# Patient Record
Sex: Female | Born: 1985 | Smoking: Never smoker
Health system: Southern US, Community
[De-identification: ages and names within clinical notes are randomized; demographics above are authoritative.]

## PROBLEM LIST (undated history)

## (undated) DIAGNOSIS — Z9889 Other specified postprocedural states: Secondary | ICD-10-CM

## (undated) DIAGNOSIS — J45909 Unspecified asthma, uncomplicated: Secondary | ICD-10-CM

## (undated) HISTORY — PX: CARPAL TUNNEL RELEASE: SHX101

---

## 2013-08-28 HISTORY — PX: OTHER SURGICAL HISTORY: SHX169

## 2014-08-17 ENCOUNTER — Ambulatory Visit: Payer: No Typology Code available for payment source | Attending: Chiropractic Medicine | Admitting: Physical Therapy

## 2014-11-09 LAB — OB RESULTS CONSOLE HIV ANTIBODY (ROUTINE TESTING): HIV: NONREACTIVE

## 2014-11-09 LAB — OB RESULTS CONSOLE RPR: RPR: NONREACTIVE

## 2014-11-09 LAB — OB RESULTS CONSOLE RUBELLA ANTIBODY, IGM: RUBELLA: IMMUNE

## 2014-11-09 LAB — OB RESULTS CONSOLE ANTIBODY SCREEN: ANTIBODY SCREEN: NEGATIVE

## 2014-11-09 LAB — OB RESULTS CONSOLE GC/CHLAMYDIA
CHLAMYDIA, DNA PROBE: NEGATIVE
GC PROBE AMP, GENITAL: NEGATIVE

## 2014-11-09 LAB — OB RESULTS CONSOLE ABO/RH: RH Type: NEGATIVE

## 2014-11-09 LAB — OB RESULTS CONSOLE HEPATITIS B SURFACE ANTIGEN: Hepatitis B Surface Ag: NEGATIVE

## 2014-12-15 ENCOUNTER — Other Ambulatory Visit (HOSPITAL_COMMUNITY): Payer: Self-pay | Admitting: Obstetrics and Gynecology

## 2014-12-15 DIAGNOSIS — O280 Abnormal hematological finding on antenatal screening of mother: Secondary | ICD-10-CM

## 2015-01-12 ENCOUNTER — Encounter (HOSPITAL_COMMUNITY): Payer: Self-pay

## 2015-01-12 ENCOUNTER — Ambulatory Visit (HOSPITAL_COMMUNITY)
Admission: RE | Admit: 2015-01-12 | Discharge: 2015-01-12 | Disposition: A | Discharge status: Home / Self Care | Payer: Medicaid Other | Source: Ambulatory Visit | Attending: Obstetrics and Gynecology | Admitting: Obstetrics and Gynecology

## 2015-01-12 DIAGNOSIS — Z315 Encounter for genetic counseling: Secondary | ICD-10-CM | POA: Diagnosis present

## 2015-01-12 DIAGNOSIS — Z3689 Encounter for other specified antenatal screening: Secondary | ICD-10-CM | POA: Insufficient documentation

## 2015-01-12 DIAGNOSIS — Z3A20 20 weeks gestation of pregnancy: Secondary | ICD-10-CM | POA: Diagnosis not present

## 2015-01-12 DIAGNOSIS — O09899 Supervision of other high risk pregnancies, unspecified trimester: Secondary | ICD-10-CM | POA: Insufficient documentation

## 2015-01-12 DIAGNOSIS — O288 Other abnormal findings on antenatal screening of mother: Secondary | ICD-10-CM

## 2015-01-12 DIAGNOSIS — O280 Abnormal hematological finding on antenatal screening of mother: Secondary | ICD-10-CM | POA: Insufficient documentation

## 2015-01-12 DIAGNOSIS — O351XX0 Maternal care for (suspected) chromosomal abnormality in fetus, not applicable or unspecified: Secondary | ICD-10-CM | POA: Insufficient documentation

## 2015-01-12 HISTORY — DX: Unspecified asthma, uncomplicated: J45.909

## 2015-01-12 NOTE — Progress Notes (Signed)
Genetic Counseling  High-Risk Gestation Note  Appointment Date:  01/12/2015 Referred By: Ferman HammingMcNamara, Michael T, MD Date of Birth:  [birth year]   Pregnancy History: G2P1001 Estimated Date of Delivery: 06/01/15 Estimated Gestational Age: 583w0d Attending: Particia NearingMartha Decker, MD    Ms. Miranda Thomas was seen for genetic counseling because of an increased risk for fetal Down syndrome based on Quad screening through [facility].  She was counseled regarding the Quad screen result and the associated 1 in 130 risk for fetal Down syndrome.  We reviewed chromosomes, nondisjunction, and the common features and variable prognosis of Down syndrome.  In addition, we reviewed the screen adjusted reduction in risks for trisomy 18 and ONTDs.  We also discussed other explanations for a screen positive result including: a gestational dating error, differences in maternal metabolism, and normal variation. They understand that this screening is not diagnostic for Down syndrome but provides a risk assessment.   We reviewed available screening options including noninvasive prenatal screening (NIPS)/cell free DNA (cfDNA) testing and detailed ultrasound.  She was counseled that screening tests are used to modify a patient's a priori risk for aneuploidy, typically based on age. This estimate provides a pregnancy specific risk assessment. We reviewed the benefits and limitations of each option. Specifically, we discussed the conditions for which each test screens, the detection rates, and false positive rates of each. She was also counseled regarding diagnostic testing via amniocentesis. We reviewed the approximate 1 in 300-500 risk for complications for amniocentesis, including spontaneous pregnancy loss.   Detailed ultrasound was performed at the time of today's visit. Complete ultrasound results reported separately. We reviewed that visualized fetal anatomy appeared normal.   After consideration of  all the options, she declined NIPS and amniocentesis today. She understands that screening tests cannot rule out all birth defects or genetic syndromes. The patient was advised of this limitation and states she still does not want additional testing at this time. Follow-up ultrasound is recommended to be offered in the third trimester given the elevated DIA (2.1 MoM) on the Quad screen and the associated increased risk for adverse pregnancy outcomes. Please contact our office if you would like this ultrasound to be scheduled with us.   Ms. Miranda Thomas was provided with written information regarding sickle cell anemia (SCA) including the carrier frequency and incidence in the African-American population, the availability of carrier testing and prenatal diagnosis if indicated.  In addition, we discussed that hemoglobinopathies are routinely screened for as part of the [place] newborn screening panel.  Screening via hemoglobin electrophoresis is available if desired and if not previously performed.   Both family histories were reviewed and found to be contributory for the father of the pregnancy born with a hole in the heart. He reportedly did not require surgical correction and is currently healthy. Congenital heart defects occur in approximately 1% of pregnancies.  Congenital heart defects may occur due to multifactorial influences, chromosomal abnormalities, genetic syndromes or environmental exposures.  Isolated heart defects are generally multifactorial.  Given the reported family history and assuming multifactorial inheritance, the risk for a congenital heart defect in the current pregnancy would be approximately 2-3%.  Additionally, Ms. Miranda Thomas reported a female maternal first cousin once removed with intellectual disability. He was reported to possibly have eyes that are close together but no other dysmorphic features and has no physical health problems. No underlying etiology was known for his delays. Ms.  Miranda Thomas was counseled that there are many different causes  of intellectual disabilities including environmental, multifactorial, and genetic etiologies.  We discussed that a specific diagnosis for intellectual disability can be determined in approximately 50% of these individuals.  In the remaining 50% of individuals, a diagnosis may never be determined.  Regarding genetic causes, we discussed that chromosome aberrations (aneuploidy, deletions, duplications, insertions, and translocations) are responsible for a small percentage of individuals with intellectual disability.  Many individuals with chromosome aberrations have additional differences, including congenital anomalies or minor dysmorphisms.  Likewise, single gene conditions are the underlying cause of intellectual delay in some families.  We discussed that many gene conditions have intellectual disability as a feature, but also often include other physical or medical differences.  Carrier screening for Fragile X syndrome would be available to the patient, if desired.  We discussed that without more specific information, it is difficult to provide an accurate risk assessment.  Further genetic counseling is warranted if more information is obtained.  Ms. Miranda Thomas denied exposure to environmental toxins or chemical agents. She denied the use of alcohol, tobacco or street drugs. She denied significant viral illnesses during the course of her pregnancy. Her medical and surgical histories were noncontributory.   I counseled Ms. Miranda Thomas for approximately 40 minutes regarding the above risks and available options.   Quinn PlowmanKaren Khaliel Morey, MS,  Certified Genetic Counselor  01/12/2015

## 2015-01-13 ENCOUNTER — Other Ambulatory Visit (HOSPITAL_COMMUNITY): Payer: Self-pay | Admitting: Obstetrics and Gynecology

## 2015-05-05 LAB — OB RESULTS CONSOLE GBS: STREP GROUP B AG: NEGATIVE

## 2015-06-03 ENCOUNTER — Other Ambulatory Visit (HOSPITAL_COMMUNITY): Payer: Self-pay | Admitting: Nurse Practitioner

## 2015-06-03 DIAGNOSIS — Z369 Encounter for antenatal screening, unspecified: Secondary | ICD-10-CM

## 2015-06-03 DIAGNOSIS — O480 Post-term pregnancy: Secondary | ICD-10-CM

## 2015-06-04 ENCOUNTER — Encounter (HOSPITAL_COMMUNITY): Payer: Self-pay | Admitting: *Deleted

## 2015-06-04 ENCOUNTER — Ambulatory Visit (HOSPITAL_COMMUNITY): Admission: RE | Admit: 2015-06-04 | Payer: Medicaid Other | Source: Ambulatory Visit

## 2015-06-04 ENCOUNTER — Telehealth (HOSPITAL_COMMUNITY): Payer: Self-pay | Admitting: *Deleted

## 2015-06-04 NOTE — Telephone Encounter (Signed)
Preadmission screen  

## 2015-06-07 ENCOUNTER — Ambulatory Visit (HOSPITAL_COMMUNITY)
Admission: RE | Admit: 2015-06-07 | Discharge: 2015-06-07 | Disposition: A | Discharge status: Home / Self Care | Payer: Medicaid Other | Source: Ambulatory Visit | Attending: Nurse Practitioner | Admitting: Nurse Practitioner

## 2015-06-07 ENCOUNTER — Inpatient Hospital Stay (HOSPITAL_COMMUNITY): Admission: RE | Admit: 2015-06-07 | Payer: Medicaid Other | Source: Ambulatory Visit

## 2015-06-07 DIAGNOSIS — O480 Post-term pregnancy: Secondary | ICD-10-CM | POA: Insufficient documentation

## 2015-06-07 DIAGNOSIS — Z36 Encounter for antenatal screening of mother: Secondary | ICD-10-CM | POA: Insufficient documentation

## 2015-06-07 DIAGNOSIS — Z369 Encounter for antenatal screening, unspecified: Secondary | ICD-10-CM

## 2015-06-08 ENCOUNTER — Inpatient Hospital Stay (HOSPITAL_COMMUNITY)
Admission: RE | Admit: 2015-06-08 | Discharge: 2015-06-11 | DRG: 765 | Disposition: A | Discharge status: Home / Self Care | Payer: Medicaid Other | Source: Ambulatory Visit | Attending: Family Medicine | Admitting: Family Medicine

## 2015-06-08 ENCOUNTER — Inpatient Hospital Stay (HOSPITAL_COMMUNITY): Payer: Medicaid Other | Admitting: Anesthesiology

## 2015-06-08 ENCOUNTER — Encounter (HOSPITAL_COMMUNITY): Payer: Self-pay

## 2015-06-08 ENCOUNTER — Encounter (HOSPITAL_COMMUNITY)
Admission: RE | Disposition: A | Discharge status: Home / Self Care | Payer: Self-pay | Source: Ambulatory Visit | Attending: Family Medicine

## 2015-06-08 DIAGNOSIS — O99810 Abnormal glucose complicating pregnancy: Secondary | ICD-10-CM

## 2015-06-08 DIAGNOSIS — Z98891 History of uterine scar from previous surgery: Secondary | ICD-10-CM

## 2015-06-08 DIAGNOSIS — O34211 Maternal care for low transverse scar from previous cesarean delivery: Secondary | ICD-10-CM | POA: Diagnosis present

## 2015-06-08 DIAGNOSIS — O480 Post-term pregnancy: Secondary | ICD-10-CM | POA: Diagnosis present

## 2015-06-08 DIAGNOSIS — Z9889 Other specified postprocedural states: Secondary | ICD-10-CM | POA: Diagnosis present

## 2015-06-08 DIAGNOSIS — Z6841 Body Mass Index (BMI) 40.0 and over, adult: Secondary | ICD-10-CM

## 2015-06-08 DIAGNOSIS — O99214 Obesity complicating childbirth: Secondary | ICD-10-CM | POA: Diagnosis present

## 2015-06-08 DIAGNOSIS — Z3A41 41 weeks gestation of pregnancy: Secondary | ICD-10-CM

## 2015-06-08 HISTORY — DX: Other specified postprocedural states: Z98.890

## 2015-06-08 LAB — CBC
HEMATOCRIT: 36.3 % (ref 36.0–46.0)
HEMOGLOBIN: 12.1 g/dL (ref 12.0–15.0)
MCH: 30.7 pg (ref 26.0–34.0)
MCHC: 33.3 g/dL (ref 30.0–36.0)
MCV: 92.1 fL (ref 78.0–100.0)
Platelets: 205 10*3/uL (ref 150–400)
RBC: 3.94 MIL/uL (ref 3.87–5.11)
RDW: 14 % (ref 11.5–15.5)
WBC: 12.2 10*3/uL — AB (ref 4.0–10.5)

## 2015-06-08 LAB — TYPE AND SCREEN
ABO/RH(D): B NEG
ANTIBODY SCREEN: NEGATIVE

## 2015-06-08 LAB — RPR: RPR Ser Ql: NONREACTIVE

## 2015-06-08 LAB — ABO/RH: ABO/RH(D): B NEG

## 2015-06-08 SURGERY — Surgical Case
Anesthesia: Epidural

## 2015-06-08 MED ORDER — FENTANYL CITRATE (PF) 100 MCG/2ML IJ SOLN
25.0000 ug | INTRAMUSCULAR | Status: DC | PRN
  Administered 2015-06-08: 50 ug via INTRAVENOUS

## 2015-06-08 MED ORDER — BUPIVACAINE HCL (PF) 0.25 % IJ SOLN
INTRAMUSCULAR | Status: AC
  Filled 2015-06-08: qty 10

## 2015-06-08 MED ORDER — ONDANSETRON HCL 4 MG/2ML IJ SOLN: 4.0000 mg | Freq: Three times a day (TID) | INTRAMUSCULAR | Status: DC | PRN

## 2015-06-08 MED ORDER — MORPHINE SULFATE (PF) 0.5 MG/ML IJ SOLN
INTRAMUSCULAR | Status: AC
  Filled 2015-06-08: qty 100

## 2015-06-08 MED ORDER — ONDANSETRON HCL 4 MG/2ML IJ SOLN: 4.0000 mg | Freq: Once | INTRAMUSCULAR | Status: DC | PRN

## 2015-06-08 MED ORDER — MEPERIDINE HCL 25 MG/ML IJ SOLN
INTRAMUSCULAR | Status: DC | PRN
  Administered 2015-06-08 (×4): 12.5 mg via INTRAVENOUS

## 2015-06-08 MED ORDER — TERBUTALINE SULFATE 1 MG/ML IJ SOLN
0.2500 mg | Freq: Once | INTRAMUSCULAR | Status: AC | PRN
Start: 2015-06-08 — End: 2015-06-08
  Administered 2015-06-08: 0.25 mg via SUBCUTANEOUS

## 2015-06-08 MED ORDER — SIMETHICONE 80 MG PO CHEW
80.0000 mg | CHEWABLE_TABLET | ORAL | Status: DC
  Administered 2015-06-09 – 2015-06-11 (×3): 80 mg via ORAL
  Filled 2015-06-08 (×3): qty 1

## 2015-06-08 MED ORDER — EPHEDRINE 5 MG/ML INJ
10.0000 mg | INTRAVENOUS | Status: DC | PRN
Start: 2015-06-08 — End: 2015-06-08

## 2015-06-08 MED ORDER — CEFAZOLIN SODIUM-DEXTROSE 2-3 GM-% IV SOLR
2.0000 g | INTRAVENOUS | Status: AC
  Administered 2015-06-08: 2 g via INTRAVENOUS
  Filled 2015-06-08: qty 50

## 2015-06-08 MED ORDER — LACTATED RINGERS IV SOLN
500.0000 mL | INTRAVENOUS | Status: DC | PRN
  Administered 2015-06-08: 300 mL via INTRAVENOUS
  Administered 2015-06-08 (×3): 500 mL via INTRAVENOUS

## 2015-06-08 MED ORDER — OXYTOCIN 40 UNITS IN LACTATED RINGERS INFUSION - SIMPLE MED
62.5000 mL/h | INTRAVENOUS | Status: DC
Start: 2015-06-08 — End: 2015-06-08

## 2015-06-08 MED ORDER — KETOROLAC TROMETHAMINE 30 MG/ML IJ SOLN: 30.0000 mg | Freq: Four times a day (QID) | INTRAMUSCULAR | Status: AC | PRN

## 2015-06-08 MED ORDER — LIDOCAINE-EPINEPHRINE (PF) 2 %-1:200000 IJ SOLN
INTRAMUSCULAR | Status: AC
  Filled 2015-06-08: qty 20

## 2015-06-08 MED ORDER — FLEET ENEMA 7-19 GM/118ML RE ENEM: 1.0000 | ENEMA | RECTAL | Status: DC | PRN

## 2015-06-08 MED ORDER — NALOXONE HCL 0.4 MG/ML IJ SOLN: 0.4000 mg | INTRAMUSCULAR | Status: DC | PRN

## 2015-06-08 MED ORDER — FENTANYL CITRATE (PF) 100 MCG/2ML IJ SOLN: 50.0000 ug | INTRAMUSCULAR | Status: DC | PRN

## 2015-06-08 MED ORDER — KETOROLAC TROMETHAMINE 30 MG/ML IJ SOLN
30.0000 mg | Freq: Four times a day (QID) | INTRAMUSCULAR | Status: AC | PRN
  Administered 2015-06-08: 30 mg via INTRAMUSCULAR

## 2015-06-08 MED ORDER — FENTANYL 2.5 MCG/ML BUPIVACAINE 1/10 % EPIDURAL INFUSION (WH - ANES)
14.0000 mL/h | INTRAMUSCULAR | Status: DC | PRN
  Administered 2015-06-08 (×2): 14 mL/h via EPIDURAL
  Filled 2015-06-08: qty 125

## 2015-06-08 MED ORDER — PRENATAL MULTIVITAMIN CH
1.0000 | ORAL_TABLET | Freq: Every day | ORAL | Status: DC
  Administered 2015-06-10: 1 via ORAL
  Filled 2015-06-08 (×2): qty 1

## 2015-06-08 MED ORDER — ACETAMINOPHEN 325 MG PO TABS: 650.0000 mg | ORAL_TABLET | ORAL | Status: DC | PRN

## 2015-06-08 MED ORDER — LACTATED RINGERS IV SOLN
INTRAVENOUS | Status: DC | PRN
  Administered 2015-06-08 (×2): via INTRAVENOUS

## 2015-06-08 MED ORDER — LACTATED RINGERS IV SOLN
INTRAVENOUS | Status: DC | PRN
  Administered 2015-06-08: 17:00:00 via INTRAVENOUS

## 2015-06-08 MED ORDER — MENTHOL 3 MG MT LOZG: 1.0000 | LOZENGE | OROMUCOSAL | Status: DC | PRN

## 2015-06-08 MED ORDER — ONDANSETRON HCL 4 MG/2ML IJ SOLN
INTRAMUSCULAR | Status: AC
  Filled 2015-06-08: qty 2

## 2015-06-08 MED ORDER — DIPHENHYDRAMINE HCL 50 MG/ML IJ SOLN: 12.5000 mg | INTRAMUSCULAR | Status: DC | PRN

## 2015-06-08 MED ORDER — CITRIC ACID-SODIUM CITRATE 334-500 MG/5ML PO SOLN
30.0000 mL | ORAL | Status: DC | PRN
  Administered 2015-06-08: 30 mL via ORAL
  Filled 2015-06-08: qty 15

## 2015-06-08 MED ORDER — ACETAMINOPHEN 325 MG PO TABS
650.0000 mg | ORAL_TABLET | ORAL | Status: DC | PRN
  Filled 2015-06-08: qty 2

## 2015-06-08 MED ORDER — ZOLPIDEM TARTRATE 5 MG PO TABS: 5.0000 mg | ORAL_TABLET | Freq: Every evening | ORAL | Status: DC | PRN

## 2015-06-08 MED ORDER — CEFAZOLIN SODIUM-DEXTROSE 2-3 GM-% IV SOLR
INTRAVENOUS | Status: AC
  Filled 2015-06-08: qty 50

## 2015-06-08 MED ORDER — MEPERIDINE HCL 25 MG/ML IJ SOLN
INTRAMUSCULAR | Status: AC
  Filled 2015-06-08: qty 1

## 2015-06-08 MED ORDER — BUPIVACAINE HCL (PF) 0.25 % IJ SOLN
INTRAMUSCULAR | Status: DC | PRN
  Administered 2015-06-08: 30 mL

## 2015-06-08 MED ORDER — LACTATED RINGERS IV SOLN
INTRAVENOUS | Status: DC
  Administered 2015-06-08: 14:00:00 via INTRAVENOUS
  Administered 2015-06-08 (×2): 125 mL/h via INTRAVENOUS

## 2015-06-08 MED ORDER — OXYTOCIN BOLUS FROM INFUSION: 500.0000 mL | INTRAVENOUS | Status: DC

## 2015-06-08 MED ORDER — BUPIVACAINE HCL (PF) 0.25 % IJ SOLN
INTRAMUSCULAR | Status: AC
  Filled 2015-06-08: qty 20

## 2015-06-08 MED ORDER — SIMETHICONE 80 MG PO CHEW: 80.0000 mg | CHEWABLE_TABLET | ORAL | Status: DC | PRN

## 2015-06-08 MED ORDER — LANOLIN HYDROUS EX OINT: 1.0000 "application " | TOPICAL_OINTMENT | CUTANEOUS | Status: DC | PRN

## 2015-06-08 MED ORDER — OXYCODONE-ACETAMINOPHEN 5-325 MG PO TABS
1.0000 | ORAL_TABLET | ORAL | Status: DC | PRN
  Administered 2015-06-09 – 2015-06-11 (×8): 1 via ORAL
  Filled 2015-06-08 (×10): qty 1

## 2015-06-08 MED ORDER — KETOROLAC TROMETHAMINE 30 MG/ML IJ SOLN
INTRAMUSCULAR | Status: AC
  Filled 2015-06-08: qty 1

## 2015-06-08 MED ORDER — SCOPOLAMINE 1 MG/3DAYS TD PT72
1.0000 | MEDICATED_PATCH | Freq: Once | TRANSDERMAL | Status: DC
  Filled 2015-06-08: qty 1

## 2015-06-08 MED ORDER — OXYTOCIN 10 UNIT/ML IJ SOLN
INTRAMUSCULAR | Status: AC
  Filled 2015-06-08: qty 4

## 2015-06-08 MED ORDER — ONDANSETRON HCL 4 MG/2ML IJ SOLN
INTRAMUSCULAR | Status: DC | PRN
  Administered 2015-06-08: 4 mg via INTRAVENOUS

## 2015-06-08 MED ORDER — OXYTOCIN 10 UNIT/ML IJ SOLN
40.0000 [IU] | INTRAVENOUS | Status: DC | PRN
  Administered 2015-06-08: 40 [IU] via INTRAVENOUS

## 2015-06-08 MED ORDER — LACTATED RINGERS IV SOLN
INTRAVENOUS | Status: DC
  Administered 2015-06-08: 300 mL via INTRAUTERINE
  Administered 2015-06-08: 13:00:00 via INTRAUTERINE

## 2015-06-08 MED ORDER — SENNOSIDES-DOCUSATE SODIUM 8.6-50 MG PO TABS
2.0000 | ORAL_TABLET | ORAL | Status: DC
  Administered 2015-06-09 – 2015-06-11 (×3): 2 via ORAL
  Filled 2015-06-08 (×3): qty 2

## 2015-06-08 MED ORDER — LIDOCAINE HCL (PF) 1 % IJ SOLN
INTRAMUSCULAR | Status: DC | PRN
  Administered 2015-06-08 (×2): 4 mL

## 2015-06-08 MED ORDER — DIPHENHYDRAMINE HCL 25 MG PO CAPS
25.0000 mg | ORAL_CAPSULE | ORAL | Status: DC | PRN
  Administered 2015-06-08 – 2015-06-09 (×3): 25 mg via ORAL
  Filled 2015-06-08 (×3): qty 1

## 2015-06-08 MED ORDER — MORPHINE SULFATE (PF) 0.5 MG/ML IJ SOLN
INTRAMUSCULAR | Status: DC | PRN
  Administered 2015-06-08: 4 mg via EPIDURAL

## 2015-06-08 MED ORDER — OXYCODONE-ACETAMINOPHEN 5-325 MG PO TABS
2.0000 | ORAL_TABLET | ORAL | Status: DC | PRN
  Administered 2015-06-11: 2 via ORAL

## 2015-06-08 MED ORDER — LACTATED RINGERS IV SOLN
INTRAVENOUS | Status: DC
  Administered 2015-06-08: via INTRAVENOUS

## 2015-06-08 MED ORDER — LIDOCAINE HCL (PF) 1 % IJ SOLN: 30.0000 mL | INTRAMUSCULAR | Status: DC | PRN

## 2015-06-08 MED ORDER — MEPERIDINE HCL 25 MG/ML IJ SOLN: 6.2500 mg | INTRAMUSCULAR | Status: DC | PRN

## 2015-06-08 MED ORDER — IBUPROFEN 600 MG PO TABS: 600.0000 mg | ORAL_TABLET | Freq: Four times a day (QID) | ORAL | Status: DC | PRN

## 2015-06-08 MED ORDER — PHENYLEPHRINE 40 MCG/ML (10ML) SYRINGE FOR IV PUSH (FOR BLOOD PRESSURE SUPPORT)
80.0000 ug | PREFILLED_SYRINGE | INTRAVENOUS | Status: DC | PRN
Start: 2015-06-08 — End: 2015-06-08
  Filled 2015-06-08: qty 20

## 2015-06-08 MED ORDER — NALOXONE HCL 1 MG/ML IJ SOLN
1.0000 ug/kg/h | INTRAVENOUS | Status: DC | PRN
  Filled 2015-06-08: qty 2

## 2015-06-08 MED ORDER — FENTANYL CITRATE (PF) 100 MCG/2ML IJ SOLN
INTRAMUSCULAR | Status: AC
  Filled 2015-06-08: qty 2

## 2015-06-08 MED ORDER — SODIUM CHLORIDE 0.9 % IJ SOLN: 3.0000 mL | INTRAMUSCULAR | Status: DC | PRN

## 2015-06-08 MED ORDER — DIBUCAINE 1 % RE OINT: 1.0000 "application " | TOPICAL_OINTMENT | RECTAL | Status: DC | PRN

## 2015-06-08 MED ORDER — OXYTOCIN 40 UNITS IN LACTATED RINGERS INFUSION - SIMPLE MED
1.0000 m[IU]/min | INTRAVENOUS | Status: DC
  Filled 2015-06-08: qty 1000

## 2015-06-08 MED ORDER — SODIUM BICARBONATE 8.4 % IV SOLN
INTRAVENOUS | Status: AC
  Filled 2015-06-08: qty 50

## 2015-06-08 MED ORDER — DIPHENHYDRAMINE HCL 25 MG PO CAPS: 25.0000 mg | ORAL_CAPSULE | Freq: Four times a day (QID) | ORAL | Status: DC | PRN

## 2015-06-08 MED ORDER — IBUPROFEN 600 MG PO TABS
600.0000 mg | ORAL_TABLET | Freq: Four times a day (QID) | ORAL | Status: DC
  Administered 2015-06-09 – 2015-06-11 (×8): 600 mg via ORAL
  Filled 2015-06-08 (×9): qty 1

## 2015-06-08 MED ORDER — TETANUS-DIPHTH-ACELL PERTUSSIS 5-2.5-18.5 LF-MCG/0.5 IM SUSP: 0.5000 mL | Freq: Once | INTRAMUSCULAR | Status: DC

## 2015-06-08 MED ORDER — OXYTOCIN 40 UNITS IN LACTATED RINGERS INFUSION - SIMPLE MED: 62.5000 mL/h | INTRAVENOUS | Status: AC

## 2015-06-08 MED ORDER — SODIUM BICARBONATE 8.4 % IV SOLN
INTRAVENOUS | Status: DC | PRN
  Administered 2015-06-08 (×3): 5 mL via EPIDURAL

## 2015-06-08 MED ORDER — TERBUTALINE SULFATE 1 MG/ML IJ SOLN
0.2500 mg | Freq: Once | INTRAMUSCULAR | Status: AC | PRN
  Administered 2015-06-08: 0.25 mg via SUBCUTANEOUS
  Filled 2015-06-08 (×2): qty 1

## 2015-06-08 MED ORDER — SCOPOLAMINE 1 MG/3DAYS TD PT72
MEDICATED_PATCH | TRANSDERMAL | Status: DC | PRN
  Administered 2015-06-08: 1 via TRANSDERMAL

## 2015-06-08 MED ORDER — SIMETHICONE 80 MG PO CHEW
80.0000 mg | CHEWABLE_TABLET | Freq: Three times a day (TID) | ORAL | Status: DC
  Administered 2015-06-09 – 2015-06-10 (×4): 80 mg via ORAL
  Filled 2015-06-08 (×5): qty 1

## 2015-06-08 MED ORDER — ONDANSETRON HCL 4 MG/2ML IJ SOLN
4.0000 mg | Freq: Four times a day (QID) | INTRAMUSCULAR | Status: DC | PRN
  Administered 2015-06-08: 4 mg via INTRAVENOUS
  Filled 2015-06-08: qty 2

## 2015-06-08 MED ORDER — WITCH HAZEL-GLYCERIN EX PADS: 1.0000 "application " | MEDICATED_PAD | CUTANEOUS | Status: DC | PRN

## 2015-06-08 MED ORDER — SCOPOLAMINE 1 MG/3DAYS TD PT72
MEDICATED_PATCH | TRANSDERMAL | Status: AC
  Filled 2015-06-08: qty 1

## 2015-06-08 SURGICAL SUPPLY — 31 items
CLAMP CORD UMBIL (MISCELLANEOUS) IMPLANT
CLOSURE WOUND 1/2 X4 (GAUZE/BANDAGES/DRESSINGS) ×2
CLOTH BEACON ORANGE TIMEOUT ST (SAFETY) ×3 IMPLANT
DECANTER SPIKE VIAL GLASS SM (MISCELLANEOUS) ×3 IMPLANT
DRAPE SHEET LG 3/4 BI-LAMINATE (DRAPES) IMPLANT
DRSG OPSITE 11X17.75 LRG (GAUZE/BANDAGES/DRESSINGS) ×3 IMPLANT
DRSG OPSITE POSTOP 4X10 (GAUZE/BANDAGES/DRESSINGS) ×3 IMPLANT
DURAPREP 26ML APPLICATOR (WOUND CARE) ×3 IMPLANT
ELECT REM PT RETURN 9FT ADLT (ELECTROSURGICAL) ×3
ELECTRODE REM PT RTRN 9FT ADLT (ELECTROSURGICAL) ×1 IMPLANT
EXTRACTOR VACUUM M CUP 4 TUBE (SUCTIONS) IMPLANT
EXTRACTOR VACUUM M CUP 4' TUBE (SUCTIONS)
GLOVE BIOGEL PI IND STRL 7.0 (GLOVE) ×1 IMPLANT
GLOVE BIOGEL PI INDICATOR 7.0 (GLOVE) ×2
GLOVE ECLIPSE 7.0 STRL STRAW (GLOVE) ×6 IMPLANT
GOWN STRL REUS W/TWL LRG LVL3 (GOWN DISPOSABLE) ×6 IMPLANT
KIT ABG SYR 3ML LUER SLIP (SYRINGE) IMPLANT
NEEDLE HYPO 22GX1.5 SAFETY (NEEDLE) ×3 IMPLANT
NEEDLE HYPO 25X5/8 SAFETYGLIDE (NEEDLE) IMPLANT
NS IRRIG 1000ML POUR BTL (IV SOLUTION) ×3 IMPLANT
PACK C SECTION WH (CUSTOM PROCEDURE TRAY) ×3 IMPLANT
PAD OB MATERNITY 4.3X12.25 (PERSONAL CARE ITEMS) ×3 IMPLANT
PENCIL SMOKE EVAC W/HOLSTER (ELECTROSURGICAL) ×3 IMPLANT
RTRCTR C-SECT PINK 25CM LRG (MISCELLANEOUS) ×3 IMPLANT
STRIP CLOSURE SKIN 1/2X4 (GAUZE/BANDAGES/DRESSINGS) ×4 IMPLANT
SUT VIC AB 0 CTX 36 (SUTURE) ×6
SUT VIC AB 0 CTX36XBRD ANBCTRL (SUTURE) ×3 IMPLANT
SUT VIC AB 4-0 KS 27 (SUTURE) IMPLANT
SYR 30ML LL (SYRINGE) ×3 IMPLANT
TOWEL OR 17X24 6PK STRL BLUE (TOWEL DISPOSABLE) ×3 IMPLANT
TRAY FOLEY CATH SILVER 14FR (SET/KITS/TRAYS/PACK) ×3 IMPLANT

## 2015-06-08 NOTE — Progress Notes (Signed)
Pt arrived to the unit. Pressure dressing saturated. RN removed and assessed, honeycomb saturated. New honeycomb placed using sterile technique and pressure dressing re-applied. Site re-assessed and honeycomb saturated again. Dr. Wende Mott notified and ordered to change the dressing again and recheck for bleeding and notify him if bleeding occurs. Will continue to monitor.

## 2015-06-08 NOTE — Anesthesia Procedure Notes (Signed)
Epidural Patient location during procedure: OB  Staffing Anesthesiologist: Fergie Sherbert Performed by: anesthesiologist   Preanesthetic Checklist Completed: patient identified, site marked, surgical consent, pre-op evaluation, timeout performed, IV checked, risks and benefits discussed and monitors and equipment checked  Epidural Patient position: sitting Prep: ChloraPrep and site prepped and draped Patient monitoring: continuous pulse ox and blood pressure Approach: midline Location: L3-L4 Injection technique: LOR saline  Needle:  Needle type: Tuohy  Needle gauge: 17 G Needle length: 9 cm and 9 Needle insertion depth: 9 cm Catheter type: closed end flexible Catheter size: 19 Gauge Catheter at skin depth: 15 cm Test dose: negative  Assessment Events: blood not aspirated, injection not painful, no injection resistance, negative IV test and no paresthesia  Additional Notes Patient identified. Risks/Benefits/Options discussed with patient including but not limited to bleeding, infection, nerve damage, paralysis, failed block, incomplete pain control, headache, blood pressure changes, nausea, vomiting, reactions to medication both or allergic, itching and postpartum back pain. Confirmed with bedside nurse the patient's most recent platelet count. Confirmed with patient that they are not currently taking any anticoagulation, have any bleeding history or any family history of bleeding disorders. Patient expressed understanding and wished to proceed. All questions were answered. Sterile technique was used throughout the entire procedure. Please see nursing notes for vital signs. Test dose was given through epidural catheter and negative prior to continuing to dose epidural or start infusion. Warning signs of high block given to the patient including shortness of breath, tingling/numbness in hands, complete motor block, or any concerning symptoms with instructions to call for help. Patient was  given instructions on fall risk and not to get out of bed. All questions and concerns addressed with instructions to call with any issues or inadequate analgesia.

## 2015-06-08 NOTE — Anesthesia Postprocedure Evaluation (Signed)
  Anesthesia Post-op Note  Patient: Nonah Mattes  Procedure(s) Performed: Procedure(s): CESAREAN SECTION (N/A)  Patient Location: PACU  Anesthesia Type:Epidural  Level of Consciousness: awake, alert  and oriented  Airway and Oxygen Therapy: Patient Spontanous Breathing  Post-op Pain: none  Post-op Assessment: Post-op Vital signs reviewed, Patient's Cardiovascular Status Stable, Respiratory Function Stable, Patent Airway, No signs of Nausea or vomiting, Pain level controlled, No headache, No backache, Spinal receding and Patient able to bend at knees LLE Motor Response: No movement due to regional block   RLE Motor Response: No movement due to regional block   L Sensory Level: L1-Inguinal (groin) region R Sensory Level: L1-Inguinal (groin) region  Post-op Vital Signs: Reviewed and stable  Last Vitals:  Filed Vitals:   06/08/15 1945  BP: 116/73  Pulse: 73  Temp: 36.9 C  Resp: 24    Complications: No apparent anesthesia complications

## 2015-06-08 NOTE — Progress Notes (Signed)
Patient ID: Miranda Thomas, female   DOB: 07-27-1986, 29 y.o.   MRN: [id number] Patient has recurrent variable, prolonged decels which cannot be resolved with position changes and is remote from delivery.  Will move to abdominal delivery.  Risks include but are not limited to bleeding, infection, injury to surrounding structures, including bowel, bladder and ureters, blood clots, and death.  Likelihood of success is high.

## 2015-06-08 NOTE — Op Note (Signed)
Miranda Thomas PROCEDURE DATE: 06/08/2015  6:17 PM  PATIENT:  Miranda Thomas  29 y.o. female  PRE-OPERATIVE DIAGNOSIS:  Repeat Cesarean Section for Non reassuring fetal heart rate  POST-OPERATIVE DIAGNOSIS:  Repeat Cesarean Section for Non reassuring fetal heart rate  PROCEDURE:  Procedure(s): CESAREAN SECTION (N/A)  SURGEON:  Surgeon(s) and Role:    * Reva Bores, MD - Primary    * Federico Flake, MD - Fellow  ANESTHESIA:   regional  LOCAL MEDICATIONS USED:  MARCAINE     INDICATIONS: Miranda Thomas is a 29 y.o. G2P2001 at [redacted]w[redacted]d here for cesarean section secondary to the indications listed under preoperative diagnoses; please see preoperative note for further details.  The risks of cesarean section were discussed with the patient including but were not limited to: bleeding which may require transfusion or reoperation; infection which may require antibiotics; injury to bowel, bladder, ureters or other surrounding organs; injury to the fetus; need for additional procedures including hysterectomy in the event of a life-threatening hemorrhage; placental abnormalities wth subsequent pregnancies, incisional problems, thromboembolic phenomenon and other postoperative/anesthesia complications.   The patient concurred with the proposed plan, giving informed written consent for the procedure.    Decision to move to urgent CS was made at 17:07, initial skin incision at 17:19 nd infant delivered at 1722.  FINDINGS:  Viable female infant in cephalic presentation with tight nuchal cord and body cord.  Apgars 8 and 9.  Clear amniotic fluid.  Intact placenta, three vessel cord.  Normal uterus, fallopian tubes and ovaries bilaterally. Tubal cyst present on right.  Cord pH: 7.165  ANESTHESIA: Epidural INTRAVENOUS FLUIDS: 2200 ml ESTIMATED BLOOD LOSS: 600 ml URINE OUTPUT:  675 ml SPECIMENS: Placenta sent to Pathology COMPLICATIONS: None immediate  PROCEDURE IN DETAIL:  The patient  preoperatively received intravenous antibiotics and had sequential compression devices applied to her lower extremities.  She was then taken to the operating room where the epidural anesthesia was dosed up to surgical level and was found to be adequate. She was then placed in a dorsal supine position with a leftward tilt, and prepped and draped in a sterile manner.  A foley catheter was already in place from receiving epidural and attached to constant gravity.  After an adequate timeout was performed, a Pfannenstiel skin incision was made with scalpel and carried through to the underlying layer of fascia. The fascia was incised in the midline, and this incision was extended bilaterally bluntly.  Kocher clamps were applied to the superior aspect of the fascial incision and the underlying rectus muscles were dissected off bluntly. The rectus muscles were separated in the midline bluntly and the peritoneum was entered bluntly. Attention was turned to the lower uterine segment where a low transverse hysterotomy was made with a scalpel and extended bilaterally bluntly.  The infant was successfully delivered, the cord was clamped and cut and the infant was handed over to awaiting neonatology team. Uterine massage was then administered, and the placenta delivered intact with a three-vessel cord. The uterus was then cleared of clot and debris.  The hysterotomy was closed with 0 Vicryl in a running locked fashion, several figure-of eight stitches were place to imbricate the hysterotomy and support hemostasis.  The pelvis was cleared of all clot and debris. Hemostasis was confirmed on all surfaces.  The peritoneum and the muscles were reapproximated using 0 Vicryl interrupted stitches. The fascia was then closed using 0 Vicryl in a running fashion.  The subcutaneous layer  was irrigated, then reapproximated with 2-0 plain gut interrupted stitches, and 30 ml of 0.25% Marcaine was injected subcutaneously around the incision.   The skin was closed with a 4-0 Vicryl subcuticular stitch. The patient tolerated the procedure well. Sponge, lap, instrument and needle counts were correct x 2.  She was taken to the recovery room in stable condition.   Federico Flake, MD Family Medicine, OB Fellow [facility]

## 2015-06-08 NOTE — H&P (Signed)
OBSTETRIC ADMISSION HISTORY AND PHYSICAL  Miranda Thomas is a 29 y.o. female G2P1001 with IUP at [redacted]w[redacted]d by LMP presenting for PDIOL and TOLAC. She reports +FMs, No LOF, no VB, no blurry vision, headaches or peripheral edema, and RUQ pain.  She plans on bottle feeding. She requests IUD for birth control.  Dating: By LMP --->  Estimated Date of Delivery: 06/01/15  Sono:    , CWD, normal anatomy, breech presentation, tranverse lie, 295g, 39% EFW   Prenatal History/Complications:  Past Medical History: Past Medical History  Diagnosis Date  . Asthma     Past Surgical History: Past Surgical History  Procedure Laterality Date  . Cesarean section    . Carpal tunnel release    . Carpal tunnel and tendonitis relief on both hands Bilateral 2015    Obstetrical History: OB History    Gravida Para Term Preterm AB TAB SAB Ectopic Multiple Living   Social History: Social History   Social History  . Marital Status: Unknown    Spouse Name: N/A  . Number of Children: N/A  . Years of Education: N/A   Social History Main Topics  . Smoking status: Never Smoker   . Smokeless tobacco: None  . Alcohol Use: No  . Drug Use: No  . Sexual Activity: Not Asked   Other Topics Concern  . None   Social History Narrative    Family History: Family History  Problem Relation Age of Onset  . COPD Mother   . Thyroid disease Mother   . Diabetes Father     Allergies: Allergies  Allergen Reactions  . Codeine   . Shellfish Allergy     Prescriptions prior to admission  Medication Sig Dispense Refill Last Dose  . Prenatal Multivit-Min-Fe-FA (PRENATAL VITAMINS PO) Take by mouth.   Taking     Review of Systems   All systems reviewed and negative except as stated in HPI  Height  (1.575 m), weight 254 lb (115.214 kg), last menstrual period 08/25/2014. General appearance: alert, cooperative and obese Lungs: clear to auscultation bilaterally Heart: regular  rate and rhythm Abdomen: soft, non-tender; bowel sounds normal Pelvic: Cervix dilated to 4, 70% effaced Extremities: Homans sign is negative, no sign of DVT; varicose vein of medial left leg Presentation: cephalic Fetal monitoringBaseline: 145 bpm, Variability: Good {> 6 bpm), Accelerations: Reactive and Decelerations: Early Uterine activityFrequency: Every 3-6 minutes, Duration: 60-80 seconds and Intensity: moderate    Prenatal labs: ABO, Rh: --/--/B NEG (10/11 0805) Antibody: NEG (10/11 0805) Rubella:   RPR: Nonreactive (03/14 0000)  HBsAg: Negative (03/14 0000)  HIV: Non-reactive (03/14 0000)  GBS: Negative (09/07 0000)  1 hr Glucola 156 Genetic screening  Abnormal  Anatomy US nml   Prenatal Transfer Tool  Maternal Diabetes: No- failed 1 hour (156) and passed 3hr Genetic Screening: Abnormal:  Results: Other: Elevated Inhibin, DSR 1:130 Maternal Ultrasounds/Referrals: Normal Fetal Ultrasounds or other Referrals:  None Maternal Substance Abuse:  No Significant Maternal Medications:  None Significant Maternal Lab Results: Lab values include: Group B Strep negative  Results for orders placed or performed during the hospital encounter of 06/08/15 (from the past 24 hour(s))  CBC   Collection Time: 06/08/15  8:05 AM  Result Value Ref Range   WBC 12.2 (H) 4.0 - 10.5 K/uL   RBC 3.94 3.87 - 5.11 MIL/uL   Hemoglobin 12.1 12.0 - 15.0 g/dL   HCT  36.3 36.0 - 46.0 %   MCV 92.1 78.0 - 100.0 fL   MCH 30.7 26.0 - 34.0 pg   MCHC 33.3 30.0 - 36.0 g/dL   RDW 16.1 09.6 - 04.5 %   Platelets 205 150 - 400 K/uL  Type and screen [facility]   Collection Time: 06/08/15  8:05 AM  Result Value Ref Range   ABO/RH(D) B NEG    Antibody Screen NEG    Sample Expiration 06/11/2015     Patient Active Problem List   Diagnosis Date Noted  . Status post induction of labor 06/08/2015  . Abnormal maternal serum screening test   . Encounter for fetal anatomic survey   . Abnormal  antenatal AFP screen   . High risk pregnancy with high inhibin   . Abnormal quad screen     Assessment: Miranda Thomas is a 30 y.o. G2P1001 at [redacted]w[redacted]d here for PDIOL and TOLAC.  #Labor: Induce and continue to augment with pitocin. No FB as dilated to 4 cm. No cytotec due to prior C-section.  #Pain: Plans for epidural #FWB: Cat I #ID:  GBS negative #MOF: Bottle #MOC: IUD (mirena) #Circ:  N/A (female)  Federico Flake 06/08/2015, 9:34 AM

## 2015-06-08 NOTE — Brief Op Note (Signed)
06/08/2015  6:17 PM  PATIENT:  Miranda Thomas  29 y.o. female  PRE-OPERATIVE DIAGNOSIS:  Repeat Cesarean Section for Non reassuring fetal heart rate  POST-OPERATIVE DIAGNOSIS:  Repeat Cesarean Section for Non reassuring fetal heart rate  PROCEDURE:  Procedure(s): CESAREAN SECTION (N/A)  SURGEON:  Surgeon(s) and Role:    * Reva Bores, MD - Primary    * Federico Flake, MD - Fellow  ANESTHESIA:   regional  EBL:  Total I/O In: 2200 [I.V.:2200] Out: 1275 [Urine:675; Blood:600]  BLOOD ADMINISTERED:none  DRAINS: none   LOCAL MEDICATIONS USED:  MARCAINE     SPECIMEN:  Source of Specimen:  Placenta  DISPOSITION OF SPECIMEN:  L&D  COUNTS:  YES  TOURNIQUET:  * No tourniquets in log *  DICTATION: .Note written in EPIC  PLAN OF CARE: Admit to inpatient   PATIENT DISPOSITION:  PACU - hemodynamically stable.   Delay start of Pharmacological VTE agent (>24hrs) due to surgical blood loss or risk of bleeding: yes

## 2015-06-08 NOTE — Progress Notes (Signed)
Labor Progress Note Miranda Thomas is a 29 y.o. G2P1001 at [redacted]w[redacted]d presented for IOL but found to be in latent labor  S: continues to have FHR decelerations.   O:  BP 107/36 mmHg  Pulse 74  Temp(Src) 98.5 F (36.9 C) (Oral)  Resp 20  Ht  (1.575 m)  Wt 254 lb (115.214 kg)  BMI 46.45 kg/m2  SpO2 100%  LMP 08/25/2014 EFM: 130/mod/+accels/ + late decelerations, recurrent.    CVE: Dilation: 6.5 Effacement (%): 80 Cervical Position: Anterior Station: -2 Presentation: Vertex Exam by:: Aubriauna Riner MD  Toco: q 1-105minutes  A&P: 29 y.o. G2P1001 [redacted]w[redacted]d here in spontaneous labor.  #Labor: Expectant management. Currently laboring without augmentation. Discussed with patient possible need for surgical intervention/C-section if infant continues to not tolerate labor. Currently there is not an emergent need for intervention which was again discussed.  #Pain: s/p epidural. Anesthesia made aware of possible need for C-section and came to assess epidural.  #FWB: Cat II. S/p terbutaline at 1306. Continue amnioinfusion and position changes.  Infant continues to have moderate variability and FHR has rebounded appropriately with exaggerated fowlers.  #GBS: Neg #TOLAC: monitor for s/sx of uterine rupture. Previous CS was for fetal intolerance and required general anesthesia at [facility].  Dr. Shawnie Pons aware of concerns with fetal tracing.  CS consent signed at previous check.   Federico Flake, MD 4:58 PM

## 2015-06-08 NOTE — Anesthesia Preprocedure Evaluation (Signed)
Anesthesia Evaluation  Patient identified by MRN, date of birth, ID band Patient awake    Reviewed: Allergy & Precautions, NPO status , Patient's Chart, lab work & pertinent test results  History of Anesthesia Complications Negative for: history of anesthetic complications  Airway Mallampati: III  TM Distance: >3 FB Neck ROM: Full    Dental no notable dental hx. (+) Dental Advisory Given   Pulmonary asthma ,    Pulmonary exam normal breath sounds clear to auscultation       Cardiovascular negative cardio ROS Normal cardiovascular exam Rhythm:Regular Rate:Normal     Neuro/Psych negative neurological ROS  negative psych ROS   GI/Hepatic negative GI ROS, Neg liver ROS,   Endo/Other  Morbid obesity  Renal/GU negative Renal ROS  negative genitourinary   Musculoskeletal negative musculoskeletal ROS (+)   Abdominal   Peds negative pediatric ROS (+)  Hematology negative hematology ROS (+)   Anesthesia Other Findings   Reproductive/Obstetrics negative OB ROS                             Anesthesia Physical Anesthesia Plan  ASA: III  Anesthesia Plan: Epidural   Post-op Pain Management:    Induction:   Airway Management Planned:   Additional Equipment:   Intra-op Plan:   Post-operative Plan:   Informed Consent: I have reviewed the patients History and Physical, chart, labs and discussed the procedure including the risks, benefits and alternatives for the proposed anesthesia with the patient or authorized representative who has indicated his/her understanding and acceptance.   Dental advisory given  Plan Discussed with: CRNA  Anesthesia Plan Comments:         Anesthesia Quick Evaluation

## 2015-06-08 NOTE — Transfer of Care (Signed)
Immediate Anesthesia Transfer of Care Note  Patient: Miranda Thomas  Procedure(s) Performed: Procedure(s): CESAREAN SECTION (N/A)  Patient Location: PACU  Anesthesia Type:Epidural  Level of Consciousness: awake, alert  and oriented  Airway & Oxygen Therapy: Patient Spontanous Breathing  Post-op Assessment: Report given to RN and Post -op Vital signs reviewed and stable  Post vital signs: Reviewed and stable  Last Vitals:  Filed Vitals:   06/08/15 1700  BP: 104/58  Pulse: 71  Temp:   Resp:     Complications: No apparent anesthesia complications

## 2015-06-08 NOTE — Progress Notes (Signed)
Labor Progress Note Miranda Thomas is a 29 y.o. G2P1001 at [redacted]w[redacted]d presented for IOL but found to be in latent labor  S: Arrived to room because of fetal heart rate deceleration at 1430.   O:  BP 83/60 mmHg  Pulse 78  Temp(Src) 98 F (36.7 C) (Oral)  Resp 16  Ht  (1.575 m)  Wt 254 lb (115.214 kg)  BMI 46.45 kg/m2  SpO2 100%  LMP 08/25/2014 EFM: 130/mod/+accels.    At 1430, HR nadir at 55 and was at this value for < 45 seconds with rebound to the 90s for 4.5 minutes with intermittent accelerations to >100. Placed in Knee to Chest position and HR returned to 120-125 with moderate variability.  Dr. Shawnie Pons was called to assess the patient's fetal monitoring strip and arrived in the room with patient in knee-chest position. She was moved to fowlers with peanut and infant tolerated this position. Review of strip showed Late Decelerations at 1429.   CVE: Dilation: 5.5 Effacement (%): 70, 80 Station: -2 Presentation: Vertex Exam by:: Cesia Orf MD  Toco: q 2-61minutes  A&P: 29 y.o. G2P1001 [redacted]w[redacted]d here in spontaneous labor.  #Labor: Expectant management. Currently laboring without augmentation. Discussed with patient possible need for surgical intervention/C-section if infant continues to not tolerate labor. Currently there is not an emergent need for intervention.  #Pain: s/p epidural. Anesthesia made aware of possible need for C-section and came to assess epidural.  #FWB: Cat II,  Continue amnioinfusion and position changes.  Infant continues to have moderate variability and HR has rebounded appropriately.  #GBS: Neg #TOLAC: monitor for s/sx of uterine rupture. Previous CS was for fetal intolerance and required general anesthesia at [facility].  Dr. Shawnie Pons aware of concerns with fetal tracing.   Federico Flake, MD 3:10 PM

## 2015-06-08 NOTE — Progress Notes (Signed)
Labor Progress Note Miranda Thomas is a 29 y.o. G2P1001 at [redacted]w[redacted]d presented for IOL but found to be in latent labor  S: Patient receiving amnioinfusion bolus. I went to room given HR deceleration.  O:  BP 112/60 mmHg  Pulse 108  Temp(Src) 98.4 F (36.9 C) (Oral)  Resp 16  Ht  (1.575 m)  Wt 254 lb (115.214 kg)  BMI 46.45 kg/m2  SpO2 100%  LMP 08/25/2014 EFM: 115/mod/no accels, prolonged deceleration with again nadir at 65. In the 60s for < 1 minute with moderate variability throughout and recovery to baseline with position change to lithotomy (attempted semi fowlers right and left).   CVE: Dilation: 5.5 Effacement (%): 70, 80 Station: -2 Presentation: Vertex Exam by:: [name]   A&P: 29 y.o. G2P1001 [redacted]w[redacted]d here in spontaneous labor.  #Labor: Expectant management. Currently laboring without augmentation #Pain: s/p epidural #FWB: Cat II,  Continue amnioinfusion and position changes. Continue to monitor closely.  #GBS: Neg #TOLAC: monitor for s/sx of uterine rupture. Previous CS was for fetal intolerance/arrest of dilation per op report.   Federico Flake, MD 2:01 PM

## 2015-06-08 NOTE — Progress Notes (Signed)
Labor Progress Note Miranda Thomas is a 29 y.o. G2P1001 at [redacted]w[redacted]d presented for IOL but found to be in latent labor  S: Patient was a scheduled induction but was found to be contracting and 4 cm dilated. She has continued to labor and contractions became every 3-4 minutes. She SROM at 1056.  At 1245 IUPC and FSE were placed. RN called team to the room for FHR deceleration at ~1300.  Patient was repositioned.   O:  BP 102/69 mmHg  Pulse 87  Temp(Src) 98.4 F (36.9 C) (Oral)  Resp 16  Ht  (1.575 m)  Wt 254 lb (115.214 kg)  BMI 46.45 kg/m2  LMP 08/25/2014 EFM: 125/mod/no accels, prolonged deceleration, nadir of 65 with moderate variability throughout.  HR was in the 70s for ~1 minute with interval increase to the mid 90s then return to previous baseline after 4 minutes.  Terbutaline administered.   CVE: Dilation: 5.5 Effacement (%): 80 Station: Ballotable Presentation: Vertex Exam by:: dr. Alvester Morin   A&P: 29 y.o. G2P1001 [redacted]w[redacted]d here in spontaneous labor.  #Labor: Expectant management. Currently laboring without augmentation #Pain: s/p epidural #FWB: Cat II,  Low risk of acidemia given moderate variability. Given recent SROM, will start amnioinfusion.  Attending physician Dr. Tinnie Gens aware of strip and was in the room due to prolonged deceleration.  #GBS: Neg #TOLAC: monitor for s/sx of uterine rupture. Previous CS was for fetal intolerance/arrest of dilation per op report.   Federico Flake, MD 1:16 PM

## 2015-06-09 ENCOUNTER — Encounter (HOSPITAL_COMMUNITY): Payer: Self-pay | Admitting: Family Medicine

## 2015-06-09 LAB — CBC
HEMATOCRIT: 30.6 % — AB (ref 36.0–46.0)
Hemoglobin: 10 g/dL — ABNORMAL LOW (ref 12.0–15.0)
MCH: 30.3 pg (ref 26.0–34.0)
MCHC: 32.7 g/dL (ref 30.0–36.0)
MCV: 92.7 fL (ref 78.0–100.0)
Platelets: 153 10*3/uL (ref 150–400)
RBC: 3.3 MIL/uL — ABNORMAL LOW (ref 3.87–5.11)
RDW: 14.3 % (ref 11.5–15.5)
WBC: 11.7 10*3/uL — AB (ref 4.0–10.5)

## 2015-06-09 MED ORDER — RHO D IMMUNE GLOBULIN 1500 UNIT/2ML IJ SOSY
300.0000 ug | PREFILLED_SYRINGE | Freq: Once | INTRAMUSCULAR | Status: AC
  Administered 2015-06-09: 300 ug via INTRAVENOUS
  Filled 2015-06-09: qty 2

## 2015-06-09 NOTE — Addendum Note (Signed)
Addendum  created 06/09/15 0854 by Elgie CongoNataliya H Mubashir Mallek, CRNA   Modules edited: Notes Section   Notes Section:  File: [id number]

## 2015-06-09 NOTE — Progress Notes (Signed)
Post Op Day 1  Subjective:  Miranda Thomas is a 29 y.o. G2P2001 2636w0d [facility].  No acute events overnight.  Pt denies problems with ambulating, voiding or po intake.  She denies nausea or vomiting.  Pain is well controlled.  She has had flatus. She has not had bowel movement.  Lochia Small.  Plan for birth control is IUD.  Method of Feeding: Bottle  Objective: BP 110/69 mmHg  Pulse 69  Temp(Src) 99.7 F (37.6 C) (Oral)  Resp 22  Ht 5\' 2"  (1.575 m)  Wt 115.214 kg (254 lb)  BMI 46.45 kg/m2  SpO2 99%  LMP 08/25/2014  Breastfeeding? Unknown  Physical Exam:  General: alert, cooperative and no distress Lochia:normal flow Chest: CTAB Heart: RRR no m/r/g Abdomen: +BS, soft, nontender, fundus firm below umbilicus DVT Evaluation: No evidence of DVT seen on physical exam. Extremities: no edema   Recent Labs  06/08/15 0805 06/09/15 0545  HGB 12.1 10.0*  HCT 36.3 30.6*    Assessment/Plan:  ASSESSMENT: Miranda Thomas is a 29 y.o. G2P2001 9136w0d pod #1 s/p  doing well.   Plan for discharge tomorrow and Contraception IUD (OP)   LOS: 1 day   Mickie Hillieran McKeag 06/09/2015, 6:27 AM   OB fellow attestation Post Partum Day 1 I have seen and examined this patient and agree with above documentation in the resident's note.   Miranda Thomas is a 29 y.o. G2P2001 s/p rLTCS for fetal intolerance of labor, failed TOLAC.  Pt denies problems with ambulating, voiding or po intake. Pain is well controlled.  Plan for birth control is IUD.  Method of Feeding: bottle  PE:  BP 110/69 mmHg  Pulse 69  Temp(Src) 99.7 F (37.6 C) (Oral)  Resp 22  Ht 5\' 2"  (1.575 m)  Wt 254 lb (115.214 kg)  BMI 46.45 kg/m2  SpO2 99%  LMP 08/25/2014  Breastfeeding? Unknown Gen: well appearing Heart: reg rate Lungs: normal WOB Fundus firm Ext: soft, no pain, no edema  Plan for discharge: continue postoperative care.  Plan on d/c POD#2 and POD#3   Federico FlakeKimberly Niles Newton, MD 7:33  AM

## 2015-06-09 NOTE — Anesthesia Postprocedure Evaluation (Signed)
  Anesthesia Post-op Note  Patient: Miranda MattesAngel J Thomas  Procedure(s) Performed: Procedure(s): CESAREAN SECTION (N/A)  Patient Location: Mother/Baby  Anesthesia Type:Epidural  Level of Consciousness: awake, alert  and oriented  Airway and Oxygen Therapy: Patient Spontanous Breathing  Post-op Pain: none  Post-op Assessment: Post-op Vital signs reviewed and Patient's Cardiovascular Status Stable LLE Motor Response: Purposeful movement   RLE Motor Response: Purposeful movement   L Sensory Level: L1-Inguinal (groin) region R Sensory Level: L1-Inguinal (groin) region  Post-op Vital Signs: Reviewed and stable  Last Vitals:  Filed Vitals:   06/09/15 0513  BP:   Pulse:   Temp: 37.6 C  Resp: 22    Complications: No apparent anesthesia complications

## 2015-06-09 NOTE — Progress Notes (Signed)
UR chart review completed.  

## 2015-06-10 ENCOUNTER — Encounter (HOSPITAL_COMMUNITY): Payer: Self-pay

## 2015-06-10 LAB — RH IG WORKUP (INCLUDES ABO/RH)
ABO/RH(D): B NEG
FETAL SCREEN: NEGATIVE
Gestational Age(Wks): 41
UNIT DIVISION: 0

## 2015-06-10 NOTE — Progress Notes (Signed)
Post Partum Day 2  Subjective:  Miranda Thomas is a 29 y.o. [id number] 3760w0d s/p [facility]CS [facility] PDIOL for TOLAC.  No acute events overnight.  Pt denies problems with ambulating, voiding or po intake.  She denies nausea or vomiting.  Pain is well controlled but worse at sides of C/S incision.  She has had flatus. She has not had bowel movement.  Lochia Small.  Plan for birth control is IUD.  Method of Feeding: bottle.  Objective: BP 111/47 mmHg  Pulse 66  Temp(Src) 98 F (36.7 C) (Oral)  Resp 20  Ht 5\' 2"  (1.575 m)  Wt 115.214 kg (254 lb)  BMI 46.45 kg/m2  SpO2 99%  LMP 08/25/2014  Breastfeeding? Unknown  Physical Exam:  General: alert, cooperative and no distress Lochia:normal flow Chest: CTAB Heart: RRR no m/r/g Abdomen: +BS, soft, nontender, fundus firm  Uterine Fundus: firm at/below umbilicus DVT Evaluation: No evidence of DVT seen on physical exam. Extremities: No LE edema   Recent Labs  06/08/15 0805 06/09/15 0545  HGB 12.1 10.0*  HCT 36.3 30.6*    Assessment/Plan:  ASSESSMENT: Miranda Thomas is a 29 y.o. G2P2002 6860w0d pod#2 s/p rLTCS doing well.   Plan for discharge tomorrow and Contraception IUD   LOS: 2 days   Jamelle HaringHillary M Fitzgerald 06/10/2015, 9:42 AM   I have seen and examined this patient and I agree with the above. Cam HaiSHAW, Romell Cavanah CNM 8:42 AM 06/12/2015

## 2015-06-11 DIAGNOSIS — Z98891 History of uterine scar from previous surgery: Secondary | ICD-10-CM

## 2015-06-11 MED ORDER — OXYCODONE-ACETAMINOPHEN 5-325 MG PO TABS: 1.0000 | ORAL_TABLET | ORAL | Status: AC | PRN

## 2015-06-11 MED ORDER — IBUPROFEN 600 MG PO TABS: 600.0000 mg | ORAL_TABLET | Freq: Four times a day (QID) | ORAL | Status: AC

## 2015-06-11 NOTE — Discharge Summary (Signed)
OB Discharge Summary  Patient Name: Miranda Thomas DOB: [birth year] MRN: [id number]  Date of admission: 06/08/2015 Delivering MD: Reva Bores   Date of discharge: 06/11/2015  Admitting diagnosis: INDUCTION Intrauterine pregnancy: [redacted]w[redacted]d     Secondary diagnosis: TOLAC     Discharge diagnosis: Term Pregnancy Delivered and rLTCS                                                                                                Post partum procedures:None  Augmentation: Pitocin  Complications: None  Principal Problem:   Status post induction of labor Active Problems:   Abnormal O'Sullivan glucose challenge test, antepartum   Status post repeat low transverse cesarean section   Failed trial of labor, delivered  Hospital course:  Induction of Labor With Cesarean Section  29 y.o. yo G2P2002 at [redacted]w[redacted]d was admitted to the hospital 06/08/2015 for induction of labor. Patient had a labor course significant for TOLAC. The patient went for cesarean section due to Non-Reassuring FHR, and delivered a Viable infant. Details of operation can be found in separate operative Note.  Patient had an uncomplicated postpartum course. She is ambulating, tolerating a regular diet, passing flatus, and urinating well.  Patient is discharged home in stable condition on 06/11/2015 10:50 AM.                                    Physical exam  Filed Vitals:   06/10/15 0650 06/10/15 1725 06/11/15 0030 06/11/15 0558  BP: 111/47 117/81 128/75 144/79  Pulse: 66 89 88 64  Temp: 98 F (36.7 C) 97.8 F (36.6 C) 97.9 F (36.6 C) 98.3 F (36.8 C)  TempSrc:  Oral Oral Oral  Resp: Height:      Weight:      SpO2:       General: alert, cooperative and no distress Lochia: appropriate Uterine Fundus: firm Incision: Healing well with no significant drainage DVT Evaluation: No evidence of DVT seen on physical exam. Labs: Lab Results  Component Value Date   WBC 11.7* 06/09/2015   HGB 10.0* 06/09/2015    HCT 30.6* 06/09/2015   MCV 92.7 06/09/2015   PLT 153 06/09/2015   No flowsheet data found.  Discharge instruction: per After Visit Summary and "Baby and Me Booklet".  Medications: No current facility-administered medications for this encounter.  Current outpatient prescriptions:  .  ibuprofen (ADVIL,MOTRIN) 600 MG tablet, Take 1 tablet (600 mg total) by mouth every 6 (six) hours., Disp: 30 tablet, Rfl: 0 .  oxyCODONE-acetaminophen (PERCOCET/ROXICET) 5-325 MG tablet, Take 1 tablet by mouth every 4 (four) hours as needed for moderate pain or severe pain., Disp: 30 tablet, Rfl: 0  Diet: routine diet  Activity: Advance as tolerated. Pelvic rest for 6 weeks.   Outpatient follow up:6 weeks No future appointments.  Postpartum contraception: IUD Mirena  Newborn Data: Live born female  Birth Weight: 6 lb 8.9 oz (2975 g) APGAR: 8, 9  Baby Feeding: Bottle Disposition:home with mother  06/11/2015 Hillary Vernie AmmonsM Fitzgerald, MD  [facility], PGY[facility]  OB fellow attestation I have seen and examined this patient and agree with above documentation in the resident's note.   Miranda Thomas is a 29 y.o. G2P2002 s/p rLTCS.   Pain is well controlled.  Plan for birth control is IUD.  Method of Feeding: bottle  PE:  BP 144/79 mmHg  Pulse 64  Temp(Src) 98.3 F (36.8 C) (Oral)  Resp 19  Ht 5\' 2"  (1.575 m)  Wt 254 lb (115.214 kg)  BMI 46.45 kg/m2  SpO2 99%  LMP 08/25/2014  Breastfeeding? Unknown Gen: well appearing Heart: reg rate Lungs: normal WOB Fundus firm Ext: soft, no pain, no edema   Recent Labs  06/09/15 0545  HGB 10.0*  HCT 30.6*   Plan: discharge today - postpartum care discussed - f/u clinic in 6 weeks for postpartum visit   Miranda FlakeKimberly Niles Eliya Bubar, MD 8:45 PM

## 2015-06-11 NOTE — Progress Notes (Signed)
I was walking in the hallway and there was a man standing outside of room #126. He was carrying a car seat and appearing to talk to a baby in the car seat. He then threw the car seat on the floor near my feet. The car seat was empty. He was laughing and I asked "What was the point of that?" and he stated "For the reaction and I enjoyed it". He was laughing and walked into room #126 and closed the door behind him. There were several staff members and visitors in the hallway that observed.

## 2015-06-11 NOTE — Discharge Instructions (Signed)
Miranda Thomas, congratulations on delivering your baby!  We recommend vaginal rest for 6 weeks, meaning do not put anything in your vagina (no sexual intercourse, do not use tampons, etc.), until you see your doctor for your follow-up visit in 5-6 weeks.  You have prescriptions for ibuprofen and percocet for pain.  Please seek medical attention if your wound becomes red or painful or with increased drainage. If you develop a fever (temperature of 100.4 F or higher), have severe abdominal pain or increased vaginal bleeding, please contact your doctor.  Postpartum Care After Cesarean Delivery After you deliver your newborn (postpartum period), the usual stay in the hospital is 24-72 hours. If there were problems with your labor or delivery, or if you have other medical problems, you might be in the hospital longer.  While you are in the hospital, you will receive help and instructions on how to care for yourself and your newborn during the postpartum period.  While you are in the hospital:  It is normal for you to have pain or discomfort from the incision in your abdomen. Be sure to tell your nurses when you are having pain, where the pain is located, and what makes the pain worse.  If you are breastfeeding, you may feel uncomfortable contractions of your uterus for a couple of weeks. This is normal. The contractions help your uterus get back to normal size.  It is normal to have some bleeding after delivery.  For the first 1-3 days after delivery, the flow is red and the amount may be similar to a period.  It is common for the flow to start and stop.  In the first few days, you may pass some small clots. Let your nurses know if you begin to pass large clots or your flow increases.  Do not  flush blood clots down the toilet before having the nurse look at them.  During the next 3-10 days after delivery, your flow should become more watery and pink or brown-tinged in color.  Ten to fourteen  days after delivery, your flow should be a small amount of yellowish-white discharge.  The amount of your flow will decrease over the first few weeks after delivery. Your flow may stop in 6-8 weeks. Most women have had their flow stop by 12 weeks after delivery.  You should change your sanitary pads frequently.  Wash your hands thoroughly with soap and water for at least 20 seconds after changing pads, using the toilet, or before holding or feeding your newborn.  Your intravenous (IV) tubing will be removed when you are drinking enough fluids.  The urine drainage tube (urinary catheter) that was inserted before delivery may be removed within 6-8 hours after delivery or when feeling returns to your legs. You should feel like you need to empty your bladder within the first 6-8 hours after the catheter has been removed.  In case you become weak, lightheaded, or faint, call your nurse before you get out of bed for the first time and before you take a shower for the first time.  Within the first few days after delivery, your breasts may begin to feel tender and full. This is called engorgement. Breast tenderness usually goes away within 48-72 hours after engorgement occurs. You may also notice milk leaking from your breasts. If you are not breastfeeding, do not stimulate your breasts. Breast stimulation can make your breasts produce more milk.  Spending as much time as possible with your newborn is very important.  During this time, you and your newborn can feel close and get to know each other. Having your newborn stay in your room (rooming in) will help to strengthen the bond with your newborn. It will give you time to get to know your newborn and become comfortable caring for your newborn.  Your hormones change after delivery. Sometimes the hormone changes can temporarily cause you to feel sad or tearful. These feelings should not last more than a few days. If these feelings last longer than that, you  should talk to your caregiver.  If desired, talk to your caregiver about methods of family planning or contraception.  Talk to your caregiver about immunizations. Your caregiver may want you to have the following immunizations before leaving the hospital:  Tetanus, diphtheria, and pertussis (Tdap) or tetanus and diphtheria (Td) immunization. It is very important that you and your family (including grandparents) or others caring for your newborn are up-to-date with the Tdap or Td immunizations. The Tdap or Td immunization can help protect your newborn from getting ill.  Rubella immunization.  Varicella (chickenpox) immunization.  Influenza immunization. You should receive this annual immunization if you did not receive the immunization during your pregnancy.   This information is not intended to replace advice given to you by your health care provider. Make sure you discuss any questions you have with your health care provider.   Document Released: 05/08/2012 Document Reviewed: 05/08/2012 Elsevier Interactive Patient Education [employer]2016 [employer] Inc.

## 2015-06-30 ENCOUNTER — Ambulatory Visit: Payer: Medicaid Other | Admitting: *Deleted

## 2015-06-30 ENCOUNTER — Encounter: Payer: Self-pay | Admitting: *Deleted

## 2015-06-30 VITALS — BP 116/55 | HR 98 | Temp 99.6°F | Ht 62.0 in | Wt 244.6 lb

## 2015-06-30 DIAGNOSIS — Z98891 History of uterine scar from previous surgery: Secondary | ICD-10-CM

## 2015-06-30 NOTE — Progress Notes (Signed)
Patient in for a wound check. C/S 3 weeks ago, low transverse incision. Patient denies pain or fever. States small amount of drainage and foul odor. Upon examination noted tiny open spot to right of midline, hard to touch underneath. Upon palpation small amount of clear bloody drainage noted. Cleaned area with sterile saline and 2x2 guaze, patted dry. Remainder of incision is well approximated. No redness or edema. Dr Marice Potterove came to examine wound. No orders given. Encouraged pt to keep wound clean and dry. Gave her 4X4 gauze and remainder of NS to clean the wound at home. Reviewed s/s of infection, call if drainage increases or if she starts to run fever. Patient voiced understanding.

## 2016-09-12 IMAGING — US US MFM FETAL BPP W/O NON-STRESS
1 series · 15 of 21 positions shown · non-contrast
Comparison: none

[Series 1: us mfm fetal bpp w/o non-stress · 21 acquisitions, 15 frames shown]
[im 1/21]
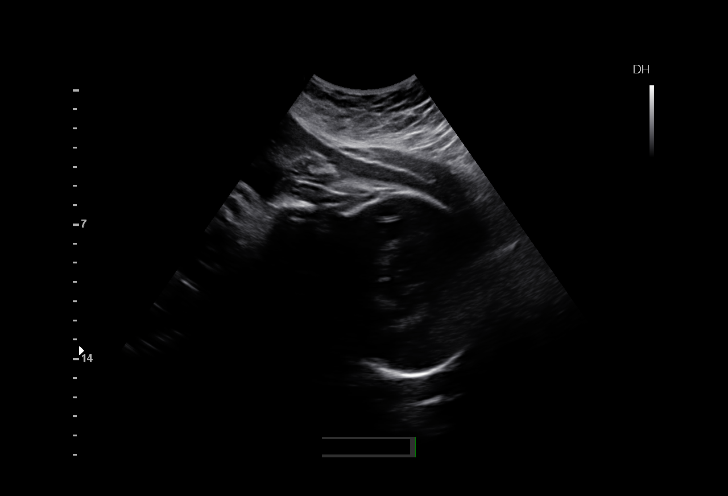
[im 3/21]
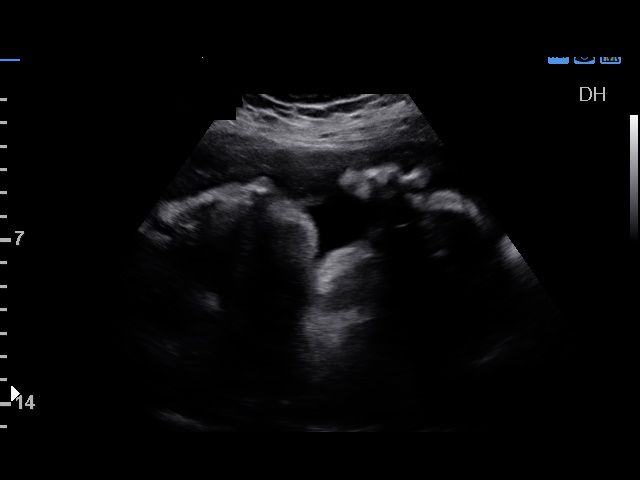
[im 4/21]
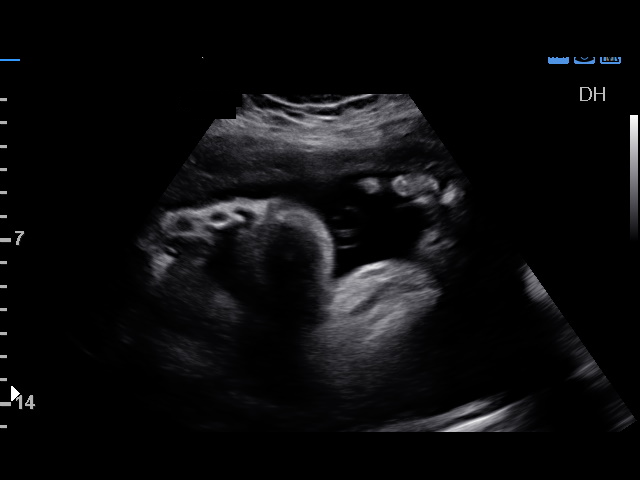
[im 5/21]
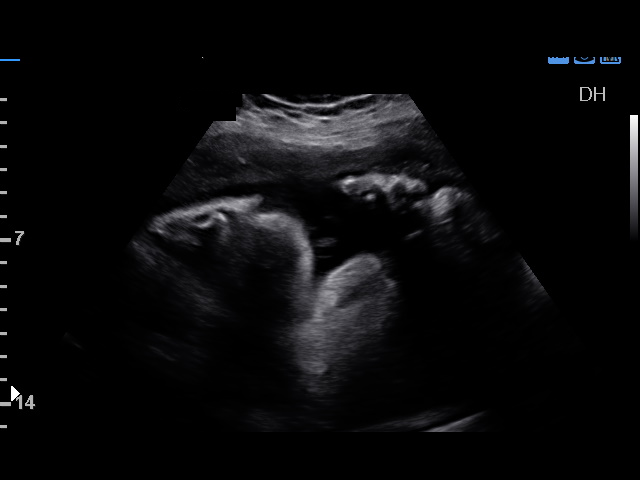
[im 7/21]
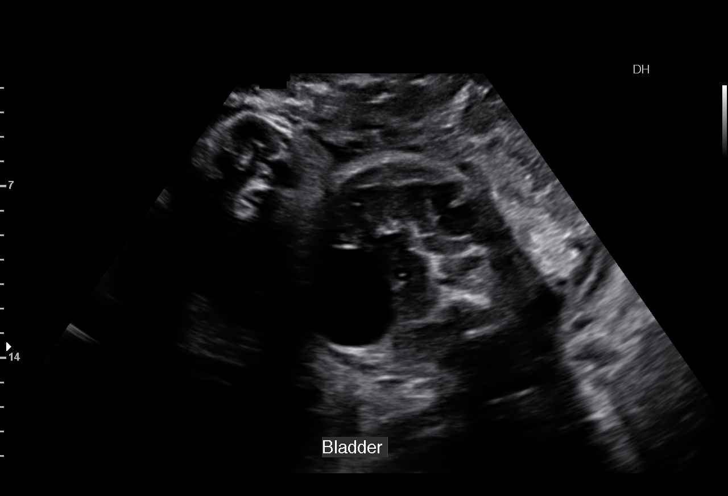
[im 8/21]
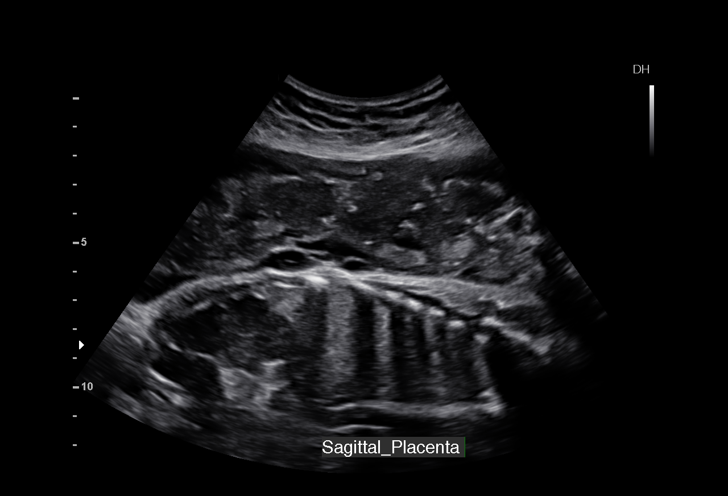
[im 10/21]
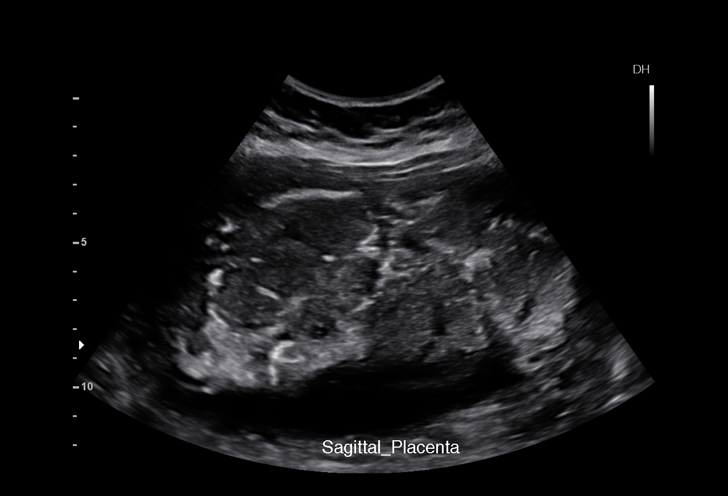
[im 11/21]
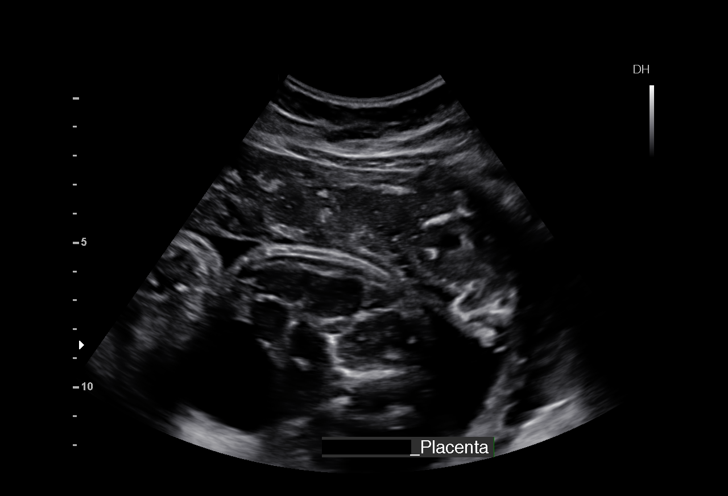
[im 12/21]
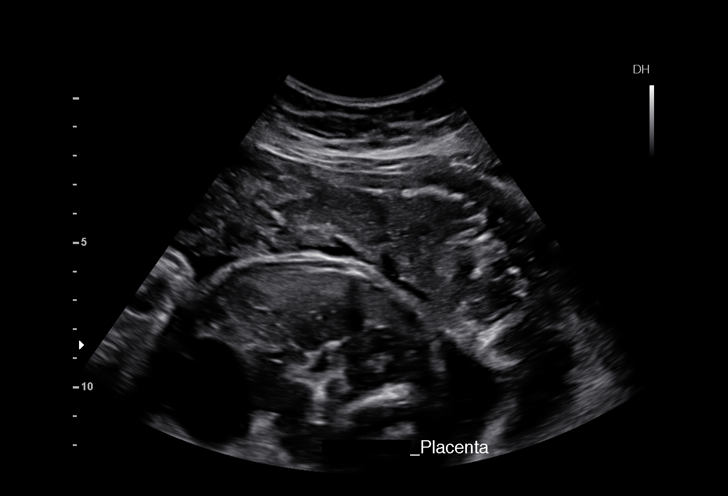
[im 14/21]
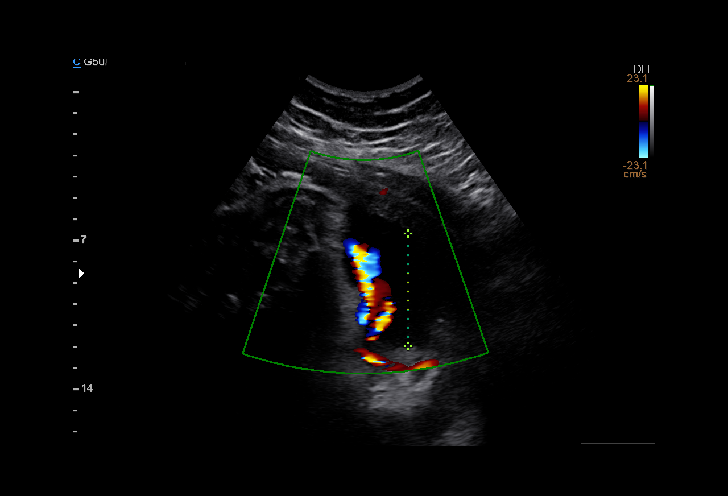
[im 15/21]
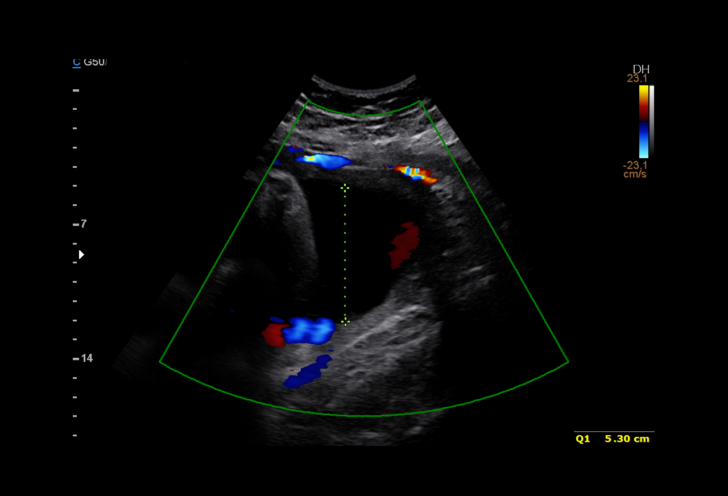
[im 17/21]
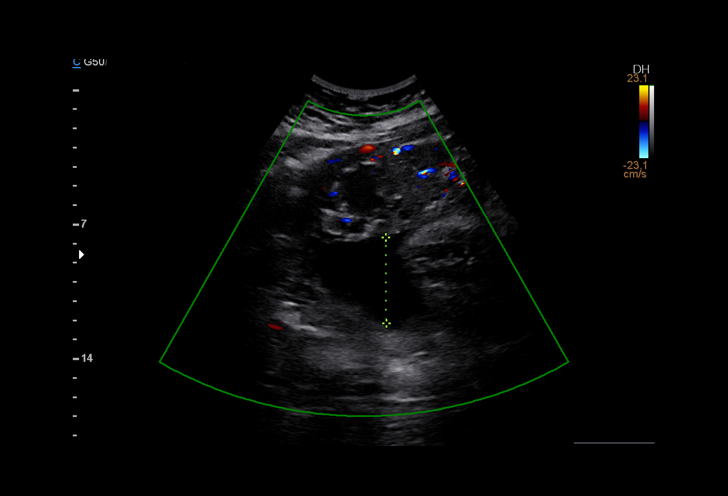
[im 18/21]
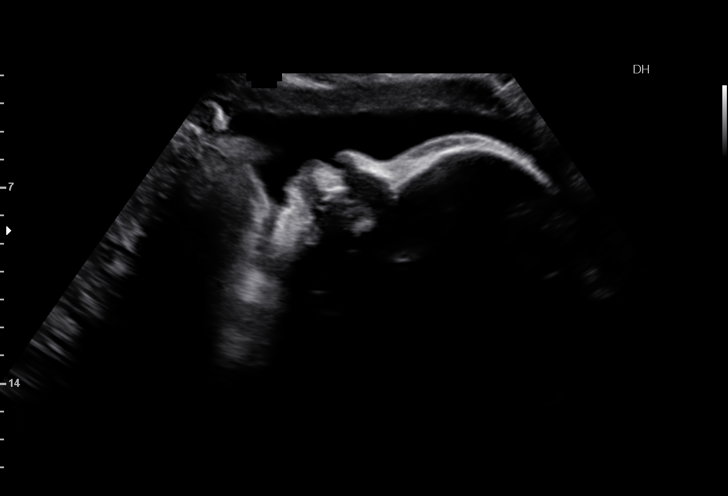
[im 19/21]
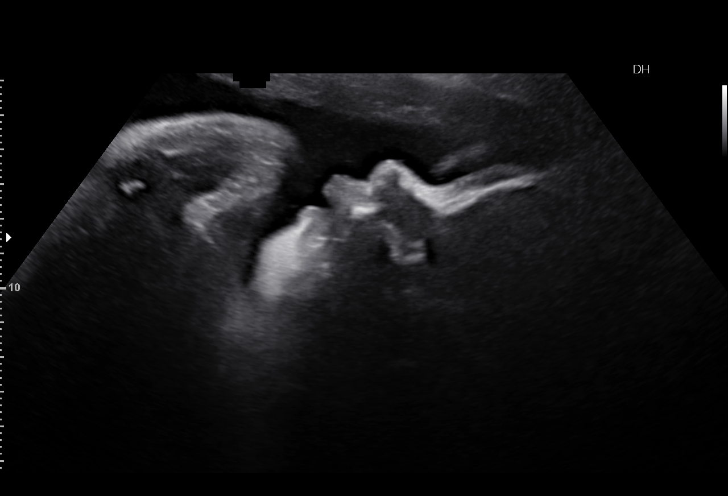
[im 21/21]
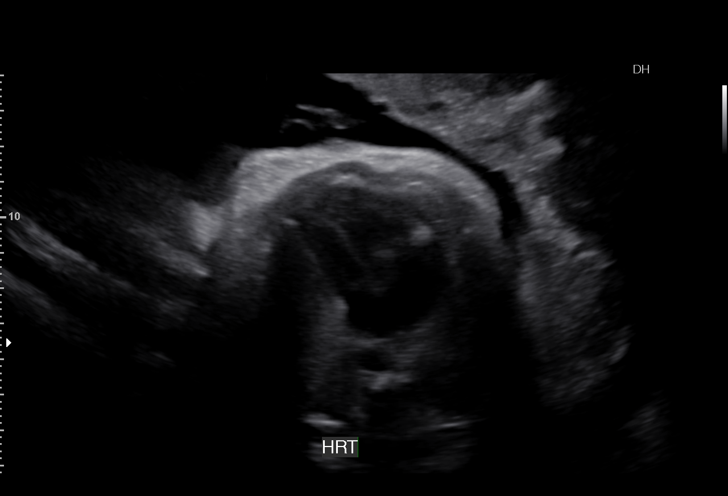

[15 of 21 positions shown; findings below may reference images not displayed]

OBSTETRICS REPORT
(Signed Final 06/08/2015 [DATE])

Date:

Service(s) Provided

Indications

40 weeks gestation of pregnancy
Postdate pregnancy (40-42 weeks)
Abnormal biochemical screen (quad) for Trisomy
21 ([DATE]) - declined further testing
Elevated Inhibin (2.18 MoM)
Asthma                                                 DMM.EM [id number]
History of cesarean delivery, currently pregnant
Obesity complicating pregnancy, second
trimester
Fetal Evaluation

Num Of             1
Fetuses:
Fetal Heart        138                          bpm
Rate:
Cardiac Activity:  Observed
Presentation:      Cephalic
Placenta:          Anterior, above cervical
os
P. Cord            Previously Visualized
Insertion:

Amniotic Fluid
AFI FV:      Subjectively within normal limits
AFI Sum:     22.46    cm    > 97  %Tile     Larg Pckt:    6.95   cm
RUQ:   5.3     cm    RLQ:   4.47    cm   LUQ:    6.95    cm   LLQ:    5.74   cm
Biophysical Evaluation

Amniotic F.V:   Pocket => 2 cm two          F. Tone:        Observed
planes
F. Movement:    Observed                    Score:          [DATE]
F. Breathing:   Not Observed
Gestational Age
LMP:           40w 6d        Date:  08/25/14                  EDD:   06/01/15
Best:          40w 6d    Det. By:   LMP  (08/25/14)           EDD:   06/01/15
Impression

SIUP at 40+6 weeks
Normal amniotic fluid volume
BPP [DATE] (-2 for no BM)

Per pt, NST reactive in office this AM (BPP [DATE]); will
attempt to obtain documentation of this NST.
Recommendations

For induction tomorrow

## 2022-08-21 ENCOUNTER — Other Ambulatory Visit: Payer: Self-pay

## 2022-08-21 ENCOUNTER — Encounter (HOSPITAL_BASED_OUTPATIENT_CLINIC_OR_DEPARTMENT_OTHER): Payer: Self-pay

## 2022-08-21 ENCOUNTER — Emergency Department (HOSPITAL_BASED_OUTPATIENT_CLINIC_OR_DEPARTMENT_OTHER)
Admission: EM | Admit: 2022-08-21 | Discharge: 2022-08-21 | Disposition: A | Discharge status: Home / Self Care | Payer: Medicaid Other | Attending: Emergency Medicine | Admitting: Emergency Medicine

## 2022-08-21 DIAGNOSIS — Z1152 Encounter for screening for COVID-19: Secondary | ICD-10-CM | POA: Insufficient documentation

## 2022-08-21 DIAGNOSIS — J101 Influenza due to other identified influenza virus with other respiratory manifestations: Secondary | ICD-10-CM | POA: Diagnosis not present

## 2022-08-21 DIAGNOSIS — R509 Fever, unspecified: Secondary | ICD-10-CM | POA: Diagnosis present

## 2022-08-21 DIAGNOSIS — J111 Influenza due to unidentified influenza virus with other respiratory manifestations: Secondary | ICD-10-CM

## 2022-08-21 LAB — RESP PANEL BY RT-PCR (RSV, FLU A&B, COVID)  RVPGX2
Influenza A by PCR: POSITIVE — AB
Influenza B by PCR: NEGATIVE
Resp Syncytial Virus by PCR: NEGATIVE
SARS Coronavirus 2 by RT PCR: NEGATIVE

## 2022-08-21 MED ORDER — BENZONATATE 100 MG PO CAPS: 100.0000 mg | ORAL_CAPSULE | Freq: Three times a day (TID) | ORAL | 0 refills | Status: AC

## 2022-08-21 MED ORDER — ACETAMINOPHEN 325 MG PO TABS
650.0000 mg | ORAL_TABLET | Freq: Once | ORAL | Status: AC | PRN
  Administered 2022-08-21: 650 mg via ORAL
  Filled 2022-08-21: qty 2

## 2022-08-21 NOTE — Discharge Instructions (Addendum)
You came to the department today due to flulike symptoms.  You tested positive for the flu.  This sort of viral illness is something that can be treated with over-the-counter medications like Tylenol and ibuprofen.  Other options include DayQuil/NyQuil, Mucinex for congestion, Robitussin/Delsym for cough and any other over-the-counter medications.  It is very important that you stay hydrated during this time as well.  You may use things like Powerade, Gatorade, electrolyte powders and water.  As we discussed, I also sent a cough medication to your pharmacy.  We hope that you feel better and do not hesitate to return to the emergency department with any worsening symptoms, especially chest pain, shortness of breath, dizziness and loss of consciousness.

## 2022-08-21 NOTE — ED Provider Notes (Signed)
[facility] EMERGENCY DEPARTMENT Provider Note   CSN: [id number] Arrival date & time: 08/21/22  1623     History  Chief Complaint  Patient presents with   Fever    CIEARA STIERWALT is a 36 y.o. female presenting with URI symptoms for 2 days.  Endorsing runny nose, cough, fever and chills.  Family with similar symptoms   Fever Associated symptoms: chills and myalgias        Home Medications Prior to Admission medications   Medication Sig Start Date End Date Taking? Authorizing Provider  ibuprofen (ADVIL,MOTRIN) 600 MG tablet Take 1 tablet (600 mg total) by mouth every 6 (six) hours. Patient not taking: Reported on 06/30/2015 06/11/15   Casey Burkitt, MD  oxyCODONE-acetaminophen (PERCOCET/ROXICET) 5-325 MG tablet Take 1 tablet by mouth every 4 (four) hours as needed for moderate pain or severe pain. Patient not taking: Reported on 06/30/2015 06/11/15   Casey Burkitt, MD  Prenatal Vit-Fe Fumarate-FA (PRENATAL MULTIVITAMIN) TABS tablet Take 1 tablet by mouth daily at 12 noon.    [provider]      Allergies    Shellfish allergy and Codeine    Review of Systems   Review of Systems  Constitutional:  Positive for chills and fever.  Musculoskeletal:  Positive for myalgias.    Physical Exam Updated Vital Signs BP 126/87 (BP Location: Right Arm)   Pulse (!) 103   Temp (!) 100.8 F (38.2 C) (Oral)   Resp 18   Ht 5\' 3"  (1.6 m)   Wt 118.4 kg   LMP 08/01/2022   SpO2 97%   BMI 46.23 kg/m  Physical Exam Vitals and nursing note reviewed.  Constitutional:      Appearance: Normal appearance.  HENT:     Head: Normocephalic and atraumatic.  Eyes:     General: No scleral icterus.    Conjunctiva/sclera: Conjunctivae normal.  Cardiovascular:     Rate and Rhythm: Regular rhythm. Tachycardia present.  Pulmonary:     Effort: Pulmonary effort is normal. No respiratory distress.  Skin:    Findings: No rash.  Neurological:     Mental  Status: She is alert.  Psychiatric:        Mood and Affect: Mood normal.     ED Results / Procedures / Treatments   Labs (all labs ordered are listed, but only abnormal results are displayed) Labs Reviewed  RESP PANEL BY RT-PCR (RSV, FLU A&B, COVID)  RVPGX2 - Abnormal; Notable for the following components:      Result Value   Influenza A by PCR POSITIVE (*)    All other components within normal limits    EKG None  Radiology No results found.  Procedures Procedures   Medications Ordered in ED Medications  acetaminophen (TYLENOL) tablet 650 mg (650 mg Oral Given 08/21/22 1759)    ED Course/ Medical Decision Making/ A&P                           Medical Decision Making Risk Prescription drug management.   36 year old female presenting today with URI symptoms.  Symptoms have been going on for 2 days.  On physical exam they are stable appearing.  Tested positive for the flu. We discussed that the flu is something that needs to run its course and they may use ibuprofen, Tylenol, DayQuil/NyQuil and other over-the-counter medications for their symptoms.  Tessalon was also sent to their pharmacy   Return precautions discussed  and they understand that they may follow-up on their symptoms outpatient with either PCP or urgent care as needed for nonemergent symptoms.  Agreeable to discharge at this time.   Final Clinical Impression(s) / ED Diagnoses Final diagnoses:  Flu    Rx / DC Orders ED Discharge Orders          Ordered    benzonatate (TESSALON) 100 MG capsule  Every 8 hours        08/21/22 1839           Results and diagnoses were explained to the patient. Return precautions discussed in full. Patient had no additional questions and expressed complete understanding.   This chart was dictated using voice recognition software.  Despite best efforts to proofread,  errors can occur which can change the documentation meaning.    Saddie Benders, PA-C 08/21/22  1858    Virgina Norfolk, DO 08/21/22 2012

## 2022-08-21 NOTE — ED Triage Notes (Signed)
Presents with fever, runny nose, chills and cough x 2 days

## 2022-08-21 NOTE — ED Notes (Signed)
Pt discharged to home. Discharge instructions have been discussed with patient and/or family members. Pt verbally acknowledges understanding d/c instructions, and endorses comprehension to checkout at registration before leaving.  °
